# Patient Record
Sex: Male | Born: 1945 | Race: White | Hispanic: No | Marital: Married | Smoking: Current every day smoker
Health system: Southern US, Community
[De-identification: ages and names within clinical notes are randomized; demographics above are authoritative.]

---

## 2001-04-23 ENCOUNTER — Ambulatory Visit (HOSPITAL_COMMUNITY): Admission: RE | Admit: 2001-04-23 | Discharge: 2001-04-23 | Payer: Self-pay | Admitting: Interventional Cardiology

## 2006-02-22 ENCOUNTER — Ambulatory Visit (HOSPITAL_BASED_OUTPATIENT_CLINIC_OR_DEPARTMENT_OTHER): Admission: RE | Admit: 2006-02-22 | Discharge: 2006-02-22 | Payer: Self-pay | Admitting: Surgery

## 2007-06-02 ENCOUNTER — Other Ambulatory Visit: Admission: RE | Admit: 2007-06-02 | Discharge: 2007-06-02 | Payer: Self-pay | Admitting: Family Medicine

## 2010-07-28 NOTE — Cardiovascular Report (Signed)
[facility]  Patient:    Allen Rodgers, Allen Rodgers Visit Number: [id number] MRN: [id number]          Service Type: CAT Location: [facility] 2870 01 Attending Physician:  Lyn Records. Iii Dictated by:   Darci Needle, M.D. Proc. Date: 04/23/01 Admit Date:  04/23/2001 Discharge Date: 04/23/2001   CC:         Molly Maduro K. Abigail Miyamoto, M.D.  Volney American, M.D., [facility], [place]. of Cardiology                        Cardiac Catheterization  INDICATION FOR PROCEDURE:  The patient is asymptomatic and had an exercise Cardiolite stress test performed that demonstrated a large inferior wall defect that was felt to possibly represent diaphragm attenuation.  There were no ischemic EKG changes on electrocardiographic assessment with a good exercise tolerance with the patient going greater than eight minutes on the Bruce protocol.  Because of concern about the images however and in conjunction with the patients risk factor profile, I felt that coronary artery disease needed to be excluded.  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  Because of this I encouraged the patient to undergo coronary angiography to define coronary anatomy and help guide therapy.  PROCEDURE PERFORMED: 1. Left heart catheterization. 2. Selective coronary angiography. 3. Left ventriculography. 4. Perclose arteriotomy closure.  DESCRIPTION OF PROCEDURE:  After informed consent, a #6 French sheath was inserted into the right femoral artery using the modified Seldinger technique. A #6 French A2 multipurpose catheter was used for hemodynamic recordings, left ventriculography by hand injection and power injection, and right coronary angiography.  We used a #6 [place] #4 left Judkins catheter for  left coronary angiography.  The patient tolerated the procedure without significant complications.  A sheathogram in the right femoral demonstrated adequate position for Perclose.  This was performed without any complications or significant sequelae.  RESULTS:   I. Hemodynamic data      A. Aortic pressure 136/83.      B. Left ventricular pressure 136/18.  II. Left ventriculography:  The left ventricle was normal in size and      demonstrates overall normal contractility.  There is a region in the mid      inferior wall that is slightly deformed during systole but does show      normal systolic motion.  No mitral regurgitation. III. Selective coronary angiography      A. Left main coronary:  Normal.      B. Left anterior descending coronary:  The LAD gives origin to two         diagonal branches.  The entire LAD system is normal.  The LAD wraps         the apex.      C. Circumflex artery:  The circumflex artery is large and normal.  It         gives origin to two obtuse marginal branches.  The first obtuse         marginal is large and dominant and trifurcates on the left lateral         wall.  The third obtuse marginal is small and free of any significant         obstruction.  The  left atrial recurrent branch arises from the mid         circumflex.      D. Right coronary:  The right coronary is large.  It is free of any         significant obstruction.  It gives several small left ventricular         branches and a very small posterior descending branch.  Based on the size of the circumflex and the distribution of the right coronary, this would appear to be an adequate distribution of the distal right coronary.  I was unable to identify any definite evidence of inferior wall branch arteries that were occluded.  There was a large right ventricular branch that was widely patent as well.  CONCLUSIONS: 1. Normal coronary arteries. 2. Normal left ventricular function. 3. False  positive Cardiolite study and false positive MR function analysis.  RECOMMENDATIONS:  Ambulation and no restrictions on activity.Dictated by: Darci Needle, M.D. Attending Physician:  Lyn Records. Iii DD:  04/23/01 TD:  04/23/01 Job: [id number]

## 2010-07-28 NOTE — Op Note (Signed)
Allen Rodgers, Allen Rodgers             ACCOUNT NO.:  [id number]   MEDICAL RECORD NO.:  [id number]          PATIENT TYPE:  AMB   LOCATION:  DSC                          FACILITY:  [facility]   PHYSICIAN:  Wilmon Arms. Corliss Skains, M.D. DATE OF BIRTH:  1945/05/06   DATE OF PROCEDURE:  02/22/2006  DATE OF DISCHARGE:                               OPERATIVE REPORT   PREOPERATIVE DIAGNOSIS:  Right inguinal hernia.   POSTOPERATIVE DIAGNOSIS:  Right inguinal hernia.   PROCEDURE PERFORMED:  Right inguinal herniorrhaphy with mesh.   SURGEON:  Wilmon Arms. Tsuei, M.D.   ANESTHESIA:  General via LMA.   INDICATIONS:  The patient is a 65 year old male who recently noticed a  discomfort in his right groin.  This has developed into a bulge.  He was  examined in the office and was noted have a right inguinal hernia but no  evidence of a left inguinal hernia.  He had an easily reducible hernia.   DESCRIPTION OF PROCEDURE:  The patient is brought to the operating room  and placed in a supine position on the operating room table.  After an  adequate level of general anesthesia was obtained, the patient's right  groin was shaved, prepped with Betadine and draped in sterile fashion.  A time-out was taken to assure the proper patient and proper procedure.  The area above the inguinal ligament was infiltrated with 0.25%  Marcaine.  An oblique incision was made above the inguinal ligament.  Dissection was carried down through the subcutaneous tissues to the  external oblique fascia.  The external oblique fascia was opened along  the direction of its fibers, down the external ring.  The ilioinguinal  nerve was identified and was dissected free and retracted superiorly.  Blunt dissection was used to dissect around the spermatic cord.  This  was retracted with a Penrose drain.  The floor of the inguinal canal was  lax, and there was no direct defect.  There was a fairly large cord  lipoma and hernia sac which were  dissected free from the spermatic cord.  These were dissected back to the internal ring and reduced up through  the internal ring.  A medium old for pro mesh plug was inserted into the  internal ring.  The onlay portion of plug was secured with interrupted 2-  0 Prolene sutures.  A small slit was cut in the onlay portion to fit  around the spermatic cord.  The Ultrapro mesh patch was then cut to  keyhole fashion with a small slit.  This was secured beginning at the  pubic tubercle.  This was secured to the shelving edge inferiorly and  the internal oblique fascia superiorly.  The tails were sutured together  behind the spermatic cord, and the tails were tucked underneath the  external oblique fascia laterally.  The external oblique fascia was then reapproximated with 2-0 Vicryl.  Three-0 Vicryl was used to the close subcutaneous tissues and 4-0  Monocryl for the skin.  Steri-Strips and clean dressings were applied.  The patient was extubated and brought to recovery room in stable  condition.  All sponge, instrument, and needle counts were correct.      Wilmon Arms. Tsuei, M.D.  Electronically Signed     MKT/MEDQ  D:  02/22/2006  T:  02/22/2006  Job:  [id number]   cc:   Chales Salmon. Abigail Miyamoto, M.D.

## 2011-03-30 ENCOUNTER — Ambulatory Visit (INDEPENDENT_AMBULATORY_CARE_PROVIDER_SITE_OTHER): Payer: BC Managed Care – PPO

## 2011-03-30 DIAGNOSIS — R05 Cough: Secondary | ICD-10-CM

## 2011-03-30 DIAGNOSIS — J019 Acute sinusitis, unspecified: Secondary | ICD-10-CM

## 2011-10-01 ENCOUNTER — Ambulatory Visit (INDEPENDENT_AMBULATORY_CARE_PROVIDER_SITE_OTHER): Payer: BC Managed Care – PPO | Admitting: Family Medicine

## 2011-10-01 VITALS — BP 119/74 | HR 71 | Temp 98.2°F | Resp 18 | Ht 70.0 in | Wt 188.0 lb

## 2011-10-01 DIAGNOSIS — H5710 Ocular pain, unspecified eye: Secondary | ICD-10-CM

## 2011-10-01 DIAGNOSIS — H5711 Ocular pain, right eye: Secondary | ICD-10-CM

## 2011-10-01 MED ORDER — OFLOXACIN 0.3 % OP SOLN: OPHTHALMIC | Status: AC

## 2011-10-01 NOTE — Progress Notes (Signed)
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 He developed acute right eye pain. He is persisted in having the pain. It hurts most when he has his eyes open, gets relief by having his eye closed. He's tried OTC eye drops without relief. He was okay during the night last night it takes some ibuprofen, but got up this morning and open his eyes at proper started hurting again. His vision is a little bit decreased in the right eye. He normally wears reading glasses.  Objective: Visual acuity as noted. His eyes are both 1-2 mm, equal. Fundi appear benign. No gross abrasions will visible. He has a little swelling of the lateral epicanthus. No foreign body was seen. The eye was anesthetized and examined with fluorescein. No abrasions or foreign bodies could be noted.  Assessment: Eye pain post foreign  Antibiotic eyedrops. Given a couple days. If it is not doing better he is to return or see an ophthalmologist. If you'll call me we will refer him to an ophthalmologist.

## 2011-10-01 NOTE — Patient Instructions (Addendum)
Use eyedrops as directed  Call or return if worse or no better

## 2012-12-31 DIAGNOSIS — Z23 Encounter for immunization: Secondary | ICD-10-CM | POA: Diagnosis not present

## 2013-05-08 ENCOUNTER — Other Ambulatory Visit: Payer: Self-pay | Admitting: Family Medicine

## 2013-05-08 DIAGNOSIS — N289 Disorder of kidney and ureter, unspecified: Secondary | ICD-10-CM | POA: Diagnosis not present

## 2013-05-08 DIAGNOSIS — L219 Seborrheic dermatitis, unspecified: Secondary | ICD-10-CM | POA: Diagnosis not present

## 2013-05-08 DIAGNOSIS — F17200 Nicotine dependence, unspecified, uncomplicated: Secondary | ICD-10-CM

## 2013-05-08 DIAGNOSIS — I10 Essential (primary) hypertension: Secondary | ICD-10-CM | POA: Diagnosis not present

## 2013-05-08 DIAGNOSIS — N400 Enlarged prostate without lower urinary tract symptoms: Secondary | ICD-10-CM | POA: Diagnosis not present

## 2013-05-08 DIAGNOSIS — Z23 Encounter for immunization: Secondary | ICD-10-CM | POA: Diagnosis not present

## 2013-05-08 DIAGNOSIS — Z0000 Encounter for general adult medical examination without abnormal findings: Secondary | ICD-10-CM | POA: Diagnosis not present

## 2013-05-08 DIAGNOSIS — N529 Male erectile dysfunction, unspecified: Secondary | ICD-10-CM | POA: Diagnosis not present

## 2013-05-08 DIAGNOSIS — E782 Mixed hyperlipidemia: Secondary | ICD-10-CM | POA: Diagnosis not present

## 2013-05-08 DIAGNOSIS — Z125 Encounter for screening for malignant neoplasm of prostate: Secondary | ICD-10-CM | POA: Diagnosis not present

## 2013-05-25 ENCOUNTER — Ambulatory Visit
Admission: RE | Admit: 2013-05-25 | Discharge: 2013-05-25 | Disposition: A | Discharge status: Home / Self Care | Payer: Self-pay | Source: Ambulatory Visit | Attending: Family Medicine | Admitting: Family Medicine

## 2013-05-25 ENCOUNTER — Ambulatory Visit
Admission: RE | Admit: 2013-05-25 | Discharge: 2013-05-25 | Disposition: A | Discharge status: Home / Self Care | Payer: Medicare Other | Source: Ambulatory Visit | Attending: Family Medicine | Admitting: Family Medicine

## 2013-05-25 DIAGNOSIS — F17200 Nicotine dependence, unspecified, uncomplicated: Secondary | ICD-10-CM

## 2013-05-25 DIAGNOSIS — Z136 Encounter for screening for cardiovascular disorders: Secondary | ICD-10-CM | POA: Diagnosis not present

## 2013-08-28 DIAGNOSIS — M79609 Pain in unspecified limb: Secondary | ICD-10-CM | POA: Diagnosis not present

## 2013-09-18 DIAGNOSIS — M25579 Pain in unspecified ankle and joints of unspecified foot: Secondary | ICD-10-CM | POA: Diagnosis not present

## 2013-10-19 DIAGNOSIS — M19079 Primary osteoarthritis, unspecified ankle and foot: Secondary | ICD-10-CM | POA: Diagnosis not present

## 2013-11-23 DIAGNOSIS — M19079 Primary osteoarthritis, unspecified ankle and foot: Secondary | ICD-10-CM | POA: Diagnosis not present

## 2013-11-30 DIAGNOSIS — Z23 Encounter for immunization: Secondary | ICD-10-CM | POA: Diagnosis not present

## 2014-01-03 DIAGNOSIS — R509 Fever, unspecified: Secondary | ICD-10-CM | POA: Diagnosis not present

## 2014-01-03 DIAGNOSIS — J069 Acute upper respiratory infection, unspecified: Secondary | ICD-10-CM | POA: Diagnosis not present

## 2014-05-14 ENCOUNTER — Other Ambulatory Visit: Payer: Self-pay | Admitting: Family Medicine

## 2014-05-14 DIAGNOSIS — R0681 Apnea, not elsewhere classified: Secondary | ICD-10-CM | POA: Diagnosis not present

## 2014-05-14 DIAGNOSIS — R7989 Other specified abnormal findings of blood chemistry: Secondary | ICD-10-CM | POA: Diagnosis not present

## 2014-05-14 DIAGNOSIS — F17200 Nicotine dependence, unspecified, uncomplicated: Secondary | ICD-10-CM | POA: Diagnosis not present

## 2014-05-14 DIAGNOSIS — N529 Male erectile dysfunction, unspecified: Secondary | ICD-10-CM | POA: Diagnosis not present

## 2014-05-14 DIAGNOSIS — R8290 Unspecified abnormal findings in urine: Secondary | ICD-10-CM | POA: Diagnosis not present

## 2014-05-14 DIAGNOSIS — N400 Enlarged prostate without lower urinary tract symptoms: Secondary | ICD-10-CM | POA: Diagnosis not present

## 2014-05-14 DIAGNOSIS — Z0000 Encounter for general adult medical examination without abnormal findings: Secondary | ICD-10-CM | POA: Diagnosis not present

## 2014-05-14 DIAGNOSIS — E782 Mixed hyperlipidemia: Secondary | ICD-10-CM | POA: Diagnosis not present

## 2014-05-14 DIAGNOSIS — I10 Essential (primary) hypertension: Secondary | ICD-10-CM | POA: Diagnosis not present

## 2014-05-14 DIAGNOSIS — Z125 Encounter for screening for malignant neoplasm of prostate: Secondary | ICD-10-CM | POA: Diagnosis not present

## 2014-12-11 IMAGING — CT CT CHEST NODULE FOLLOW UP LOW DOSE W/O CM
1 of 4 series · 15 of 36 positions shown, 19 images · non-contrast
Comparison: None.

CLINICAL DATA: Lung cancer screening.  Ex smoker.

EXAM:
CT CHEST SCREENING WITHOUT CONTRAST
TECHNIQUE: Multidetector CT imaging of the chest was performed following the
standard low-dose protocol without IV contrast.

[Series 3: lung windows · axial · 0.70mm/px · z∈[-373,-57]mm · 15 of 281 slices shown, 19 images]
[im 14/281  mediastinal]
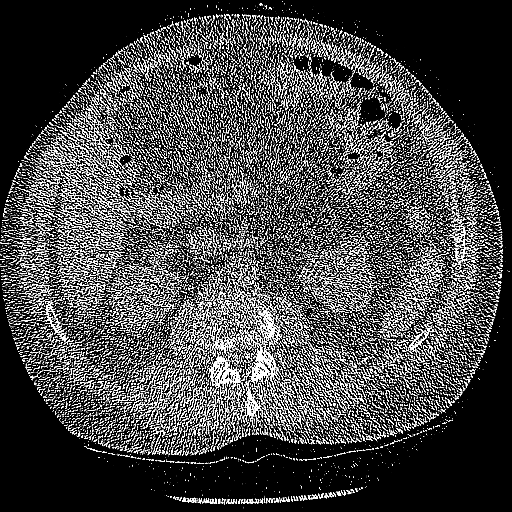
[im 14/281  lung]
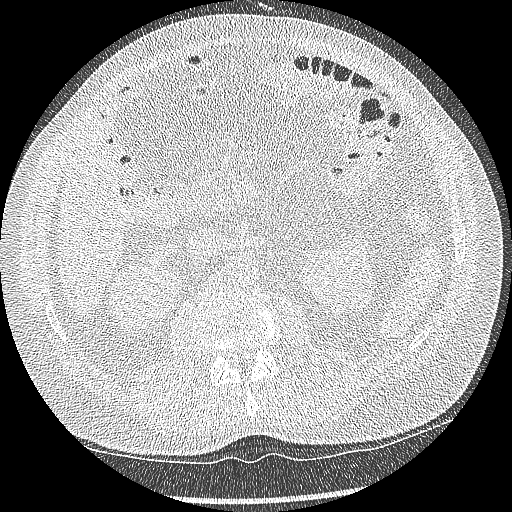
[im 41/281  lung]
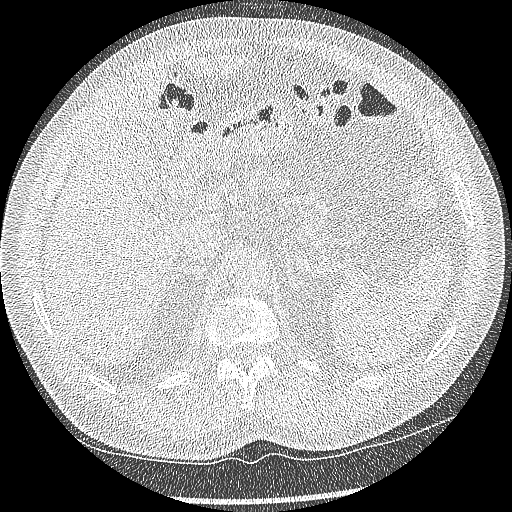
[im 54/281  lung]
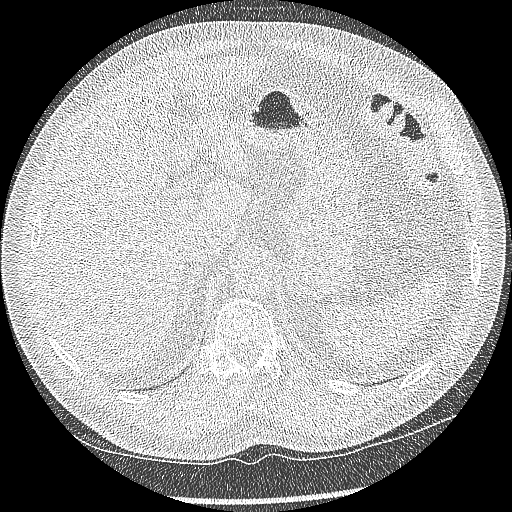
[im 67/281  lung]
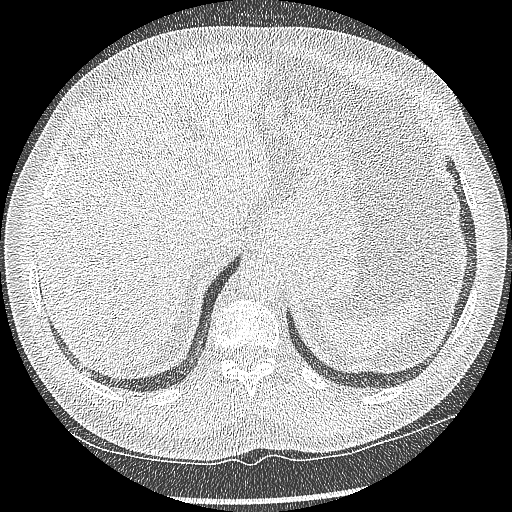
[im 94/281  mediastinal]
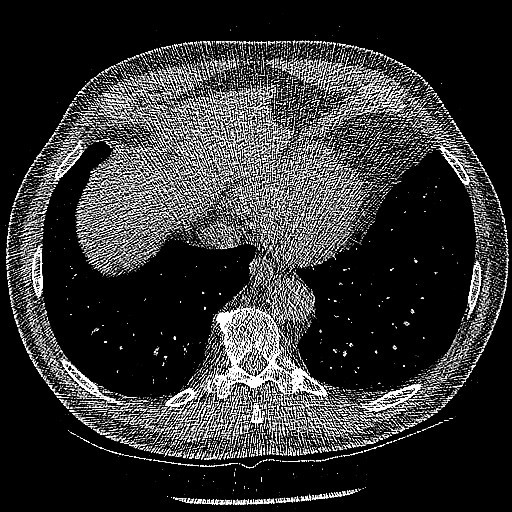
[im 94/281  lung]
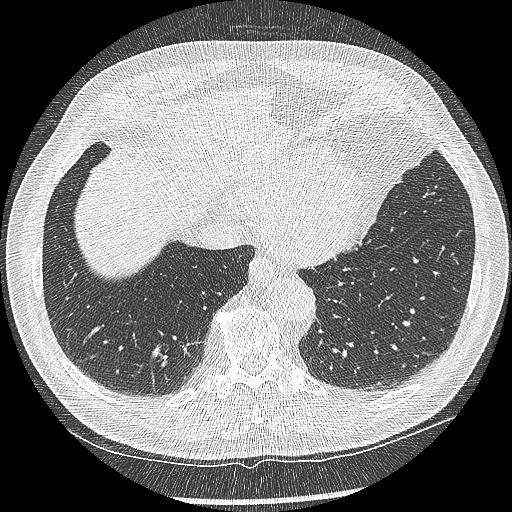
[im 107/281  lung]
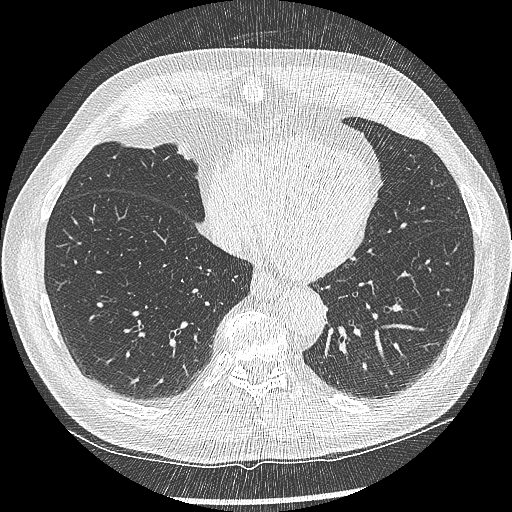
[im 121/281  lung]
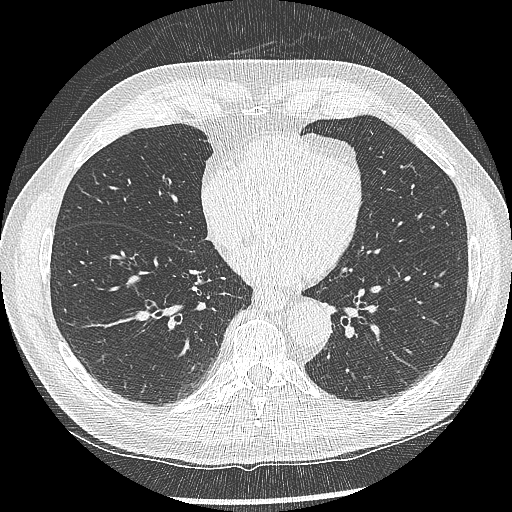
[im 147/281  lung]
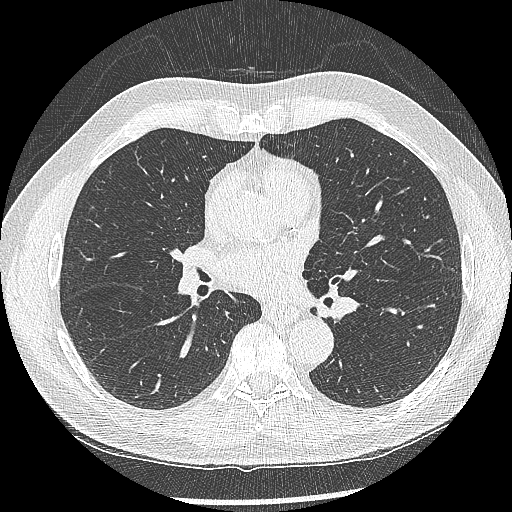
[im 161/281  mediastinal]
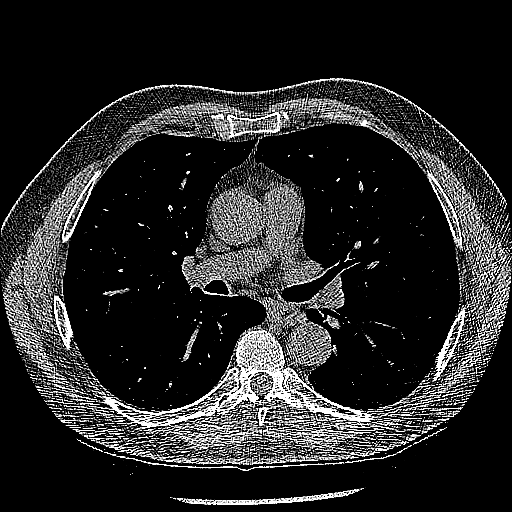
[im 161/281  lung]
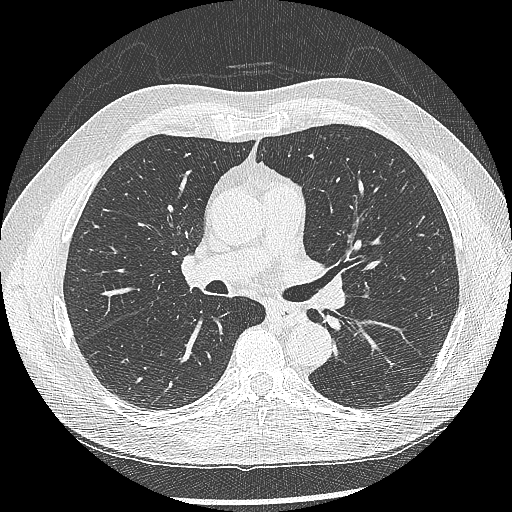
[im 174/281  lung]
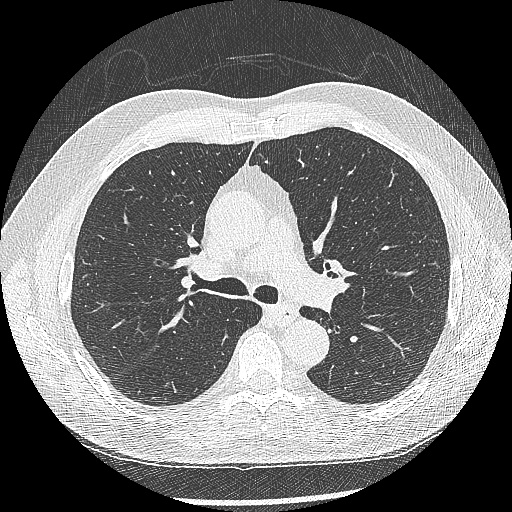
[im 201/281  lung]
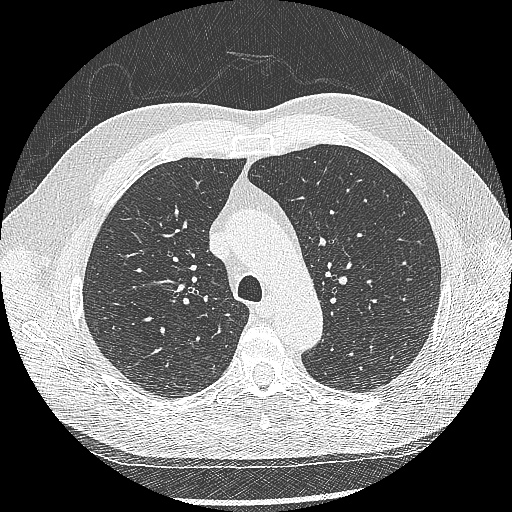
[im 214/281  lung]
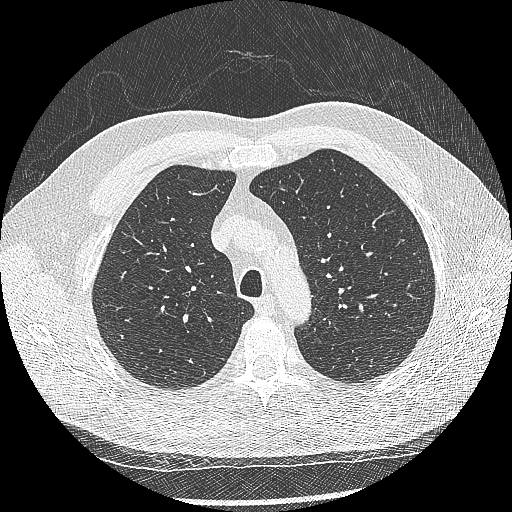
[im 227/281  mediastinal]
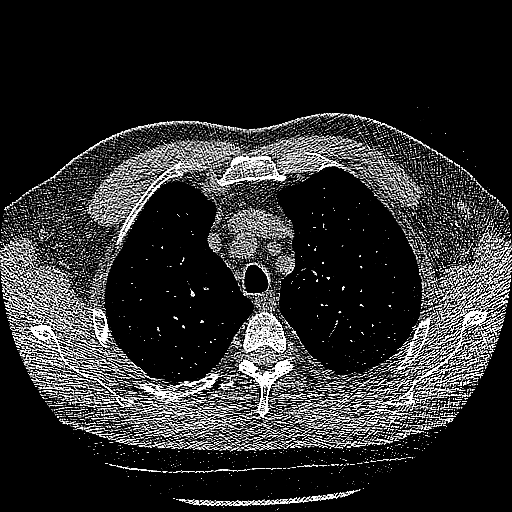
[im 227/281  lung]
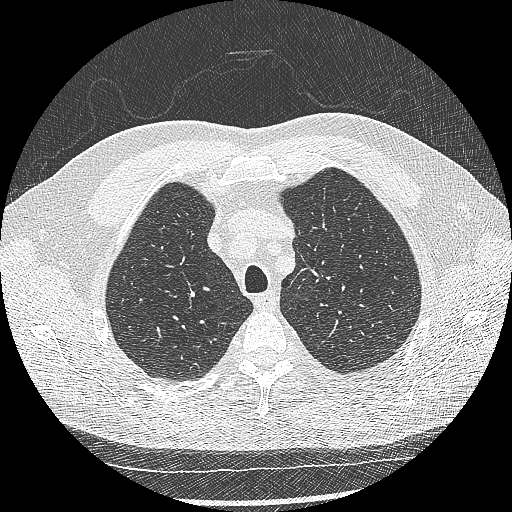
[im 254/281  lung]
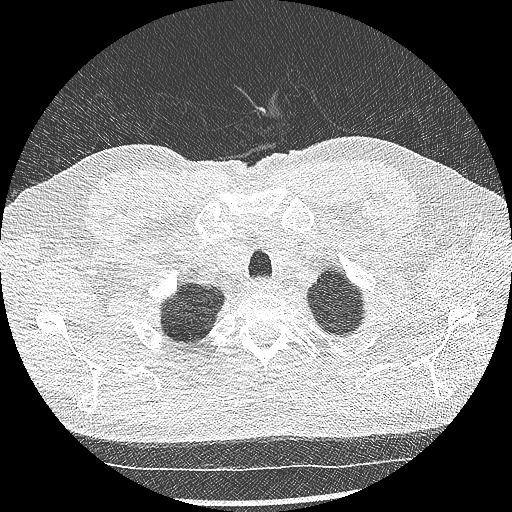
[im 267/281  lung]
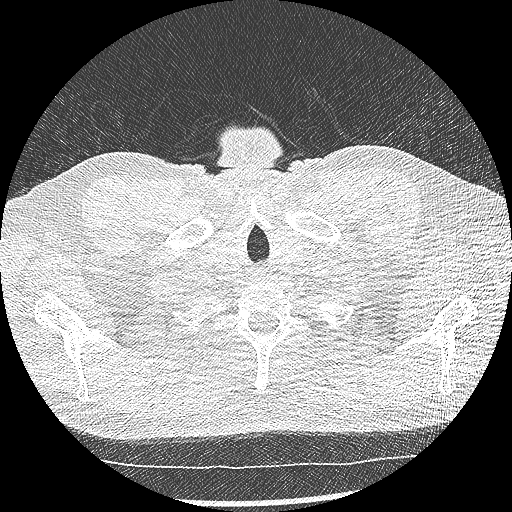

[15 of 36 positions shown; findings below may reference images not displayed]

FINDINGS: No pathologically enlarged mediastinal or axillary lymph nodes.
Hilar regions are difficult to definitively evaluate but appear
grossly unremarkable. Atherosclerotic calcification of the arterial
vasculature, including three-vessel involvement of the coronary
arteries. Ascending aorta measures at the upper limits of normal,
3.9 cm. Heart size normal. No pericardial effusion.

Body habitus degrades image quality. No dominant pulmonary nodule.
Centrilobular emphysema. Minimal scarring or atelectasis in the
lower lobes. Lungs are otherwise clear. No pleural fluid. Airway is
unremarkable.

Incidental imaging of the upper abdomen shows the visualized
portions of the liver, gallbladder, adrenal glands, kidneys, spleen,
pancreas, stomach and bowel to be grossly unremarkable. No worrisome
lytic or sclerotic lesions. Degenerative changes are seen in the
spine.
IMPRESSION: 1. Lung-RADS Category 1, negative. Continue annual screening with
low-dose chest CT without contrast in 12 months. These results will
be called to the ordering clinician or representative by the
Radiologist Assistant, and communication documented in the PACS
Dashboard.
2. Three-vessel coronary artery calcification.
3. Borderline ascending aortic aneurysm.

## 2015-06-10 ENCOUNTER — Other Ambulatory Visit: Payer: Self-pay | Admitting: Family Medicine

## 2015-06-10 DIAGNOSIS — I10 Essential (primary) hypertension: Secondary | ICD-10-CM | POA: Diagnosis not present

## 2015-06-10 DIAGNOSIS — N400 Enlarged prostate without lower urinary tract symptoms: Secondary | ICD-10-CM | POA: Diagnosis not present

## 2015-06-10 DIAGNOSIS — R8290 Unspecified abnormal findings in urine: Secondary | ICD-10-CM | POA: Diagnosis not present

## 2015-06-10 DIAGNOSIS — Z125 Encounter for screening for malignant neoplasm of prostate: Secondary | ICD-10-CM | POA: Diagnosis not present

## 2015-06-10 DIAGNOSIS — Z87891 Personal history of nicotine dependence: Secondary | ICD-10-CM

## 2015-06-10 DIAGNOSIS — E782 Mixed hyperlipidemia: Secondary | ICD-10-CM | POA: Diagnosis not present

## 2015-06-10 DIAGNOSIS — Z0000 Encounter for general adult medical examination without abnormal findings: Secondary | ICD-10-CM | POA: Diagnosis not present

## 2015-06-10 DIAGNOSIS — N529 Male erectile dysfunction, unspecified: Secondary | ICD-10-CM | POA: Diagnosis not present

## 2015-11-29 DIAGNOSIS — Z23 Encounter for immunization: Secondary | ICD-10-CM | POA: Diagnosis not present

## 2015-12-23 DIAGNOSIS — L821 Other seborrheic keratosis: Secondary | ICD-10-CM | POA: Diagnosis not present

## 2016-02-24 DIAGNOSIS — H2513 Age-related nuclear cataract, bilateral: Secondary | ICD-10-CM | POA: Diagnosis not present

## 2016-02-24 DIAGNOSIS — H25011 Cortical age-related cataract, right eye: Secondary | ICD-10-CM | POA: Diagnosis not present

## 2016-02-24 DIAGNOSIS — H35372 Puckering of macula, left eye: Secondary | ICD-10-CM | POA: Diagnosis not present

## 2016-02-24 DIAGNOSIS — H5203 Hypermetropia, bilateral: Secondary | ICD-10-CM | POA: Diagnosis not present

## 2016-04-17 DIAGNOSIS — L820 Inflamed seborrheic keratosis: Secondary | ICD-10-CM | POA: Diagnosis not present

## 2016-04-17 DIAGNOSIS — D224 Melanocytic nevi of scalp and neck: Secondary | ICD-10-CM | POA: Diagnosis not present

## 2016-07-25 DIAGNOSIS — R8290 Unspecified abnormal findings in urine: Secondary | ICD-10-CM | POA: Diagnosis not present

## 2016-07-25 DIAGNOSIS — Z0000 Encounter for general adult medical examination without abnormal findings: Secondary | ICD-10-CM | POA: Diagnosis not present

## 2016-07-25 DIAGNOSIS — N400 Enlarged prostate without lower urinary tract symptoms: Secondary | ICD-10-CM | POA: Diagnosis not present

## 2016-07-25 DIAGNOSIS — R972 Elevated prostate specific antigen [PSA]: Secondary | ICD-10-CM | POA: Diagnosis not present

## 2016-07-25 DIAGNOSIS — J449 Chronic obstructive pulmonary disease, unspecified: Secondary | ICD-10-CM | POA: Diagnosis not present

## 2016-07-25 DIAGNOSIS — N529 Male erectile dysfunction, unspecified: Secondary | ICD-10-CM | POA: Diagnosis not present

## 2016-07-25 DIAGNOSIS — I10 Essential (primary) hypertension: Secondary | ICD-10-CM | POA: Diagnosis not present

## 2016-07-25 DIAGNOSIS — R938 Abnormal findings on diagnostic imaging of other specified body structures: Secondary | ICD-10-CM | POA: Diagnosis not present

## 2016-07-25 DIAGNOSIS — N289 Disorder of kidney and ureter, unspecified: Secondary | ICD-10-CM | POA: Diagnosis not present

## 2016-07-25 DIAGNOSIS — Z125 Encounter for screening for malignant neoplasm of prostate: Secondary | ICD-10-CM | POA: Diagnosis not present

## 2016-07-25 DIAGNOSIS — E782 Mixed hyperlipidemia: Secondary | ICD-10-CM | POA: Diagnosis not present

## 2016-07-26 DIAGNOSIS — Z125 Encounter for screening for malignant neoplasm of prostate: Secondary | ICD-10-CM | POA: Diagnosis not present

## 2016-07-26 DIAGNOSIS — E782 Mixed hyperlipidemia: Secondary | ICD-10-CM | POA: Diagnosis not present

## 2016-07-26 DIAGNOSIS — Z0000 Encounter for general adult medical examination without abnormal findings: Secondary | ICD-10-CM | POA: Diagnosis not present

## 2016-07-26 DIAGNOSIS — J449 Chronic obstructive pulmonary disease, unspecified: Secondary | ICD-10-CM | POA: Diagnosis not present

## 2016-07-26 DIAGNOSIS — R938 Abnormal findings on diagnostic imaging of other specified body structures: Secondary | ICD-10-CM | POA: Diagnosis not present

## 2016-07-26 DIAGNOSIS — N529 Male erectile dysfunction, unspecified: Secondary | ICD-10-CM | POA: Diagnosis not present

## 2016-07-26 DIAGNOSIS — I10 Essential (primary) hypertension: Secondary | ICD-10-CM | POA: Diagnosis not present

## 2016-07-26 DIAGNOSIS — R8290 Unspecified abnormal findings in urine: Secondary | ICD-10-CM | POA: Diagnosis not present

## 2016-07-26 DIAGNOSIS — N400 Enlarged prostate without lower urinary tract symptoms: Secondary | ICD-10-CM | POA: Diagnosis not present

## 2016-07-30 ENCOUNTER — Other Ambulatory Visit: Payer: Self-pay | Admitting: Family Medicine

## 2016-07-30 DIAGNOSIS — N289 Disorder of kidney and ureter, unspecified: Secondary | ICD-10-CM

## 2016-07-31 ENCOUNTER — Ambulatory Visit
Admission: RE | Admit: 2016-07-31 | Discharge: 2016-07-31 | Disposition: A | Discharge status: Home / Self Care | Payer: Medicare Other | Source: Ambulatory Visit | Attending: Family Medicine | Admitting: Family Medicine

## 2016-07-31 DIAGNOSIS — R944 Abnormal results of kidney function studies: Secondary | ICD-10-CM | POA: Diagnosis not present

## 2016-07-31 DIAGNOSIS — N289 Disorder of kidney and ureter, unspecified: Secondary | ICD-10-CM

## 2016-08-29 DIAGNOSIS — R899 Unspecified abnormal finding in specimens from other organs, systems and tissues: Secondary | ICD-10-CM | POA: Diagnosis not present

## 2016-08-29 DIAGNOSIS — N400 Enlarged prostate without lower urinary tract symptoms: Secondary | ICD-10-CM | POA: Diagnosis not present

## 2016-08-30 ENCOUNTER — Other Ambulatory Visit: Payer: Self-pay | Admitting: Family Medicine

## 2016-08-30 DIAGNOSIS — R9389 Abnormal findings on diagnostic imaging of other specified body structures: Secondary | ICD-10-CM

## 2016-08-30 DIAGNOSIS — I719 Aortic aneurysm of unspecified site, without rupture: Secondary | ICD-10-CM

## 2016-09-04 ENCOUNTER — Other Ambulatory Visit: Payer: Medicare Other

## 2016-09-05 ENCOUNTER — Ambulatory Visit
Admission: RE | Admit: 2016-09-05 | Discharge: 2016-09-05 | Disposition: A | Discharge status: Home / Self Care | Payer: Medicare Other | Source: Ambulatory Visit | Attending: Family Medicine | Admitting: Family Medicine

## 2016-09-05 DIAGNOSIS — R9389 Abnormal findings on diagnostic imaging of other specified body structures: Secondary | ICD-10-CM

## 2016-09-05 DIAGNOSIS — J439 Emphysema, unspecified: Secondary | ICD-10-CM | POA: Diagnosis not present

## 2016-09-05 MED ORDER — IOPAMIDOL (ISOVUE-300) INJECTION 61%
75.0000 mL | Freq: Once | INTRAVENOUS | Status: AC | PRN
  Administered 2016-09-05: 75 mL via INTRAVENOUS

## 2016-12-19 DIAGNOSIS — Z23 Encounter for immunization: Secondary | ICD-10-CM | POA: Diagnosis not present

## 2017-03-21 DIAGNOSIS — H5203 Hypermetropia, bilateral: Secondary | ICD-10-CM | POA: Diagnosis not present

## 2017-03-21 DIAGNOSIS — H35372 Puckering of macula, left eye: Secondary | ICD-10-CM | POA: Diagnosis not present

## 2017-03-21 DIAGNOSIS — H25011 Cortical age-related cataract, right eye: Secondary | ICD-10-CM | POA: Diagnosis not present

## 2017-08-21 DIAGNOSIS — J449 Chronic obstructive pulmonary disease, unspecified: Secondary | ICD-10-CM | POA: Diagnosis not present

## 2017-08-21 DIAGNOSIS — E782 Mixed hyperlipidemia: Secondary | ICD-10-CM | POA: Diagnosis not present

## 2017-08-21 DIAGNOSIS — Z0000 Encounter for general adult medical examination without abnormal findings: Secondary | ICD-10-CM | POA: Diagnosis not present

## 2017-08-21 DIAGNOSIS — N529 Male erectile dysfunction, unspecified: Secondary | ICD-10-CM | POA: Diagnosis not present

## 2017-08-21 DIAGNOSIS — Z1211 Encounter for screening for malignant neoplasm of colon: Secondary | ICD-10-CM | POA: Diagnosis not present

## 2017-08-21 DIAGNOSIS — I10 Essential (primary) hypertension: Secondary | ICD-10-CM | POA: Diagnosis not present

## 2017-08-21 DIAGNOSIS — I712 Thoracic aortic aneurysm, without rupture: Secondary | ICD-10-CM | POA: Diagnosis not present

## 2017-08-21 DIAGNOSIS — Z1389 Encounter for screening for other disorder: Secondary | ICD-10-CM | POA: Diagnosis not present

## 2017-08-21 DIAGNOSIS — Z125 Encounter for screening for malignant neoplasm of prostate: Secondary | ICD-10-CM | POA: Diagnosis not present

## 2017-08-21 DIAGNOSIS — Z23 Encounter for immunization: Secondary | ICD-10-CM | POA: Diagnosis not present

## 2017-08-21 DIAGNOSIS — N400 Enlarged prostate without lower urinary tract symptoms: Secondary | ICD-10-CM | POA: Diagnosis not present

## 2017-12-19 DIAGNOSIS — Z23 Encounter for immunization: Secondary | ICD-10-CM | POA: Diagnosis not present

## 2018-10-08 ENCOUNTER — Other Ambulatory Visit: Payer: Self-pay | Admitting: Family Medicine

## 2018-10-08 DIAGNOSIS — I10 Essential (primary) hypertension: Secondary | ICD-10-CM | POA: Diagnosis not present

## 2018-10-08 DIAGNOSIS — Z125 Encounter for screening for malignant neoplasm of prostate: Secondary | ICD-10-CM | POA: Diagnosis not present

## 2018-10-08 DIAGNOSIS — J449 Chronic obstructive pulmonary disease, unspecified: Secondary | ICD-10-CM | POA: Diagnosis not present

## 2018-10-08 DIAGNOSIS — I712 Thoracic aortic aneurysm, without rupture: Secondary | ICD-10-CM

## 2018-10-08 DIAGNOSIS — I7121 Aneurysm of the ascending aorta, without rupture: Secondary | ICD-10-CM

## 2018-10-08 DIAGNOSIS — E782 Mixed hyperlipidemia: Secondary | ICD-10-CM | POA: Diagnosis not present

## 2018-10-08 DIAGNOSIS — N400 Enlarged prostate without lower urinary tract symptoms: Secondary | ICD-10-CM | POA: Diagnosis not present

## 2018-10-08 DIAGNOSIS — R7301 Impaired fasting glucose: Secondary | ICD-10-CM | POA: Diagnosis not present

## 2018-10-08 DIAGNOSIS — Z1211 Encounter for screening for malignant neoplasm of colon: Secondary | ICD-10-CM | POA: Diagnosis not present

## 2018-10-20 ENCOUNTER — Ambulatory Visit
Admission: RE | Admit: 2018-10-20 | Discharge: 2018-10-20 | Disposition: A | Discharge status: Home / Self Care | Payer: Medicare Other | Source: Ambulatory Visit | Attending: Family Medicine | Admitting: Family Medicine

## 2018-10-20 DIAGNOSIS — I7121 Aneurysm of the ascending aorta, without rupture: Secondary | ICD-10-CM

## 2018-10-20 DIAGNOSIS — I712 Thoracic aortic aneurysm, without rupture: Secondary | ICD-10-CM

## 2018-10-20 MED ORDER — IOPAMIDOL (ISOVUE-300) INJECTION 61%
75.0000 mL | Freq: Once | INTRAVENOUS | Status: AC | PRN
  Administered 2018-10-20: 75 mL via INTRAVENOUS

## 2018-10-21 DIAGNOSIS — Z125 Encounter for screening for malignant neoplasm of prostate: Secondary | ICD-10-CM | POA: Diagnosis not present

## 2018-10-21 DIAGNOSIS — Z1211 Encounter for screening for malignant neoplasm of colon: Secondary | ICD-10-CM | POA: Diagnosis not present

## 2018-10-21 DIAGNOSIS — I10 Essential (primary) hypertension: Secondary | ICD-10-CM | POA: Diagnosis not present

## 2018-12-02 DIAGNOSIS — Z1211 Encounter for screening for malignant neoplasm of colon: Secondary | ICD-10-CM | POA: Diagnosis not present

## 2018-12-09 DIAGNOSIS — Z23 Encounter for immunization: Secondary | ICD-10-CM | POA: Diagnosis not present

## 2019-01-12 DIAGNOSIS — H2513 Age-related nuclear cataract, bilateral: Secondary | ICD-10-CM | POA: Diagnosis not present

## 2019-01-12 DIAGNOSIS — H5203 Hypermetropia, bilateral: Secondary | ICD-10-CM | POA: Diagnosis not present

## 2019-01-12 DIAGNOSIS — H35373 Puckering of macula, bilateral: Secondary | ICD-10-CM | POA: Diagnosis not present

## 2019-10-14 DIAGNOSIS — Z1159 Encounter for screening for other viral diseases: Secondary | ICD-10-CM | POA: Diagnosis not present

## 2019-10-14 DIAGNOSIS — R8290 Unspecified abnormal findings in urine: Secondary | ICD-10-CM | POA: Diagnosis not present

## 2019-10-14 DIAGNOSIS — Z1211 Encounter for screening for malignant neoplasm of colon: Secondary | ICD-10-CM | POA: Diagnosis not present

## 2019-10-14 DIAGNOSIS — E782 Mixed hyperlipidemia: Secondary | ICD-10-CM | POA: Diagnosis not present

## 2019-10-14 DIAGNOSIS — N400 Enlarged prostate without lower urinary tract symptoms: Secondary | ICD-10-CM | POA: Diagnosis not present

## 2019-10-14 DIAGNOSIS — I10 Essential (primary) hypertension: Secondary | ICD-10-CM | POA: Diagnosis not present

## 2019-10-14 DIAGNOSIS — R7301 Impaired fasting glucose: Secondary | ICD-10-CM | POA: Diagnosis not present

## 2019-10-14 DIAGNOSIS — Z125 Encounter for screening for malignant neoplasm of prostate: Secondary | ICD-10-CM | POA: Diagnosis not present

## 2019-10-14 DIAGNOSIS — R899 Unspecified abnormal finding in specimens from other organs, systems and tissues: Secondary | ICD-10-CM | POA: Diagnosis not present

## 2019-10-14 DIAGNOSIS — N529 Male erectile dysfunction, unspecified: Secondary | ICD-10-CM | POA: Diagnosis not present

## 2019-10-14 DIAGNOSIS — Z0000 Encounter for general adult medical examination without abnormal findings: Secondary | ICD-10-CM | POA: Diagnosis not present

## 2019-10-14 DIAGNOSIS — J449 Chronic obstructive pulmonary disease, unspecified: Secondary | ICD-10-CM | POA: Diagnosis not present

## 2019-10-21 DIAGNOSIS — Z1211 Encounter for screening for malignant neoplasm of colon: Secondary | ICD-10-CM | POA: Diagnosis not present

## 2019-11-18 DIAGNOSIS — Z23 Encounter for immunization: Secondary | ICD-10-CM | POA: Diagnosis not present

## 2020-01-13 DIAGNOSIS — Z23 Encounter for immunization: Secondary | ICD-10-CM | POA: Diagnosis not present

## 2020-01-18 DIAGNOSIS — H35373 Puckering of macula, bilateral: Secondary | ICD-10-CM | POA: Diagnosis not present

## 2020-01-18 DIAGNOSIS — H5203 Hypermetropia, bilateral: Secondary | ICD-10-CM | POA: Diagnosis not present

## 2020-10-11 DIAGNOSIS — E782 Mixed hyperlipidemia: Secondary | ICD-10-CM | POA: Diagnosis not present

## 2020-10-11 DIAGNOSIS — N529 Male erectile dysfunction, unspecified: Secondary | ICD-10-CM | POA: Diagnosis not present

## 2020-10-11 DIAGNOSIS — Z1211 Encounter for screening for malignant neoplasm of colon: Secondary | ICD-10-CM | POA: Diagnosis not present

## 2020-10-11 DIAGNOSIS — N400 Enlarged prostate without lower urinary tract symptoms: Secondary | ICD-10-CM | POA: Diagnosis not present

## 2020-10-11 DIAGNOSIS — Z0000 Encounter for general adult medical examination without abnormal findings: Secondary | ICD-10-CM | POA: Diagnosis not present

## 2020-10-11 DIAGNOSIS — R7301 Impaired fasting glucose: Secondary | ICD-10-CM | POA: Diagnosis not present

## 2020-10-11 DIAGNOSIS — I10 Essential (primary) hypertension: Secondary | ICD-10-CM | POA: Diagnosis not present

## 2020-10-11 DIAGNOSIS — Z125 Encounter for screening for malignant neoplasm of prostate: Secondary | ICD-10-CM | POA: Diagnosis not present

## 2020-10-14 DIAGNOSIS — Z1211 Encounter for screening for malignant neoplasm of colon: Secondary | ICD-10-CM | POA: Diagnosis not present

## 2020-10-17 DIAGNOSIS — Z1389 Encounter for screening for other disorder: Secondary | ICD-10-CM | POA: Diagnosis not present

## 2020-10-17 DIAGNOSIS — Z0000 Encounter for general adult medical examination without abnormal findings: Secondary | ICD-10-CM | POA: Diagnosis not present

## 2020-12-15 DIAGNOSIS — Z23 Encounter for immunization: Secondary | ICD-10-CM | POA: Diagnosis not present

## 2021-03-22 DIAGNOSIS — Z23 Encounter for immunization: Secondary | ICD-10-CM | POA: Diagnosis not present

## 2021-04-24 DIAGNOSIS — H02834 Dermatochalasis of left upper eyelid: Secondary | ICD-10-CM | POA: Diagnosis not present

## 2021-04-24 DIAGNOSIS — H02831 Dermatochalasis of right upper eyelid: Secondary | ICD-10-CM | POA: Diagnosis not present

## 2021-04-24 DIAGNOSIS — H35372 Puckering of macula, left eye: Secondary | ICD-10-CM | POA: Diagnosis not present

## 2021-04-24 DIAGNOSIS — H2513 Age-related nuclear cataract, bilateral: Secondary | ICD-10-CM | POA: Diagnosis not present

## 2021-07-07 ENCOUNTER — Ambulatory Visit (INDEPENDENT_AMBULATORY_CARE_PROVIDER_SITE_OTHER): Payer: Medicare Other

## 2021-07-07 ENCOUNTER — Ambulatory Visit (INDEPENDENT_AMBULATORY_CARE_PROVIDER_SITE_OTHER): Payer: Medicare Other | Admitting: Podiatry

## 2021-07-07 DIAGNOSIS — M7751 Other enthesopathy of right foot: Secondary | ICD-10-CM

## 2021-07-07 DIAGNOSIS — M109 Gout, unspecified: Secondary | ICD-10-CM

## 2021-07-07 DIAGNOSIS — M779 Enthesopathy, unspecified: Secondary | ICD-10-CM

## 2021-07-07 DIAGNOSIS — M205X9 Other deformities of toe(s) (acquired), unspecified foot: Secondary | ICD-10-CM | POA: Diagnosis not present

## 2021-07-07 MED ORDER — TRIAMCINOLONE ACETONIDE 10 MG/ML IJ SUSP
10.0000 mg | Freq: Once | INTRAMUSCULAR | Status: AC
  Administered 2021-07-07: 10 mg

## 2021-07-07 NOTE — Progress Notes (Signed)
Subjective:  ? ?Patient ID: Allen Rodgers, male   DOB: 76 y.o.   MRN: [id number]  ? ?HPI ?Patient presents stating that he has had trouble with his big toe joints to a mild nature that is been going on for years but 3 days ago he developed severe redness and pain around the big toe joint right and its made it hard for him to sleep.  Patient does not smoke currently and is very active for his age ? ? ?Review of Systems  ?All other systems reviewed and are negative. ? ? ?   ?Objective:  ?Physical Exam ?Vitals and nursing note reviewed.  ?Constitutional:   ?   Appearance: He is well-developed.  ?Pulmonary:  ?   Effort: Pulmonary effort is normal.  ?Musculoskeletal:     ?   General: Normal range of motion.  ?Skin: ?   General: Skin is warm.  ?Neurological:  ?   Mental Status: He is alert.  ?  ?Neurovascular status intact muscle strength found to be adequate range of motion within normal limits.  Patient is found to have redness and inflammation with swelling around the big toe joint right foot with fluid buildup around the joint and it is painful when pressed.  Patient has good digital perfusion well oriented x3 ? ?   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 ?   ?Plan:  ?H&P reviewed both feet x-rays and at this point organ to focus on this right 1 and I did explain gout and capsulitis and I did sterile prep and then did periarticular injection first MPJ 3 mg Kenalog 5 mg Xylocaine advised on anti-inflammatory usage soaks and reappoint 1 week to recheck may require blood work. ? 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   ? ? ?

## 2021-07-07 NOTE — Patient Instructions (Signed)
Gout  Gout is painful swelling of your joints. Gout is a type of arthritis. It is caused by having too much uric acid in your body. Uric acid is a chemical that is made when your body breaks down substances called purines. If your body has too much uric acid, sharp crystals can form and build up in your joints. This causes pain and swelling. Gout attacks can happen quickly and be very painful (acute gout). Over time, the attacks can affect more joints and happen more often (chronic gout). What are the causes? Gout is caused by too much uric acid in your blood. This can happen because: Your kidneys do not remove enough uric acid from your blood. Your body makes too much uric acid. You eat too many foods that are high in purines. These foods include organ meats, some seafood, and beer. Trauma or stress can bring on an attack. What increases the risk? Having a family history of gout. Being male and middle-aged. Being male and having gone through menopause. Having an organ transplant. Taking certain medicines. Having certain conditions, such as: Being very overweight (obese). Lead poisoning. Kidney disease. A skin condition called psoriasis. Other risks include: Losing weight too quickly. Not having enough water in the body (being dehydrated). Drinking alcohol, especially beer. Drinking beverages that are sweetened with a type of sugar called fructose. What are the signs or symptoms? An attack of acute gout often starts at night and usually happens in just one joint. The most common place is the big toe. Other joints that may be affected include joints of the feet, ankle, knee, fingers, wrist, or elbow. Symptoms may include: Very bad pain. Warmth. Swelling. Stiffness. Tenderness. The affected joint may be very painful to touch. Shiny, red, or purple skin. Chills and fever. Chronic gout may cause symptoms more often. More joints may be involved. You may also have white or yellow lumps  (tophi) on your hands or feet or in other areas near your joints. How is this treated? Treatment for an acute attack may include medicines for pain and swelling, such as: NSAIDs, such as ibuprofen. Steroids taken by mouth or injected into a joint. Colchicine. This can be given by mouth or through an IV tube. Treatment to prevent future attacks may include: Taking small doses of NSAIDs or colchicine daily. Using a medicine that reduces uric acid levels in your blood, such as allopurinol. Making changes to your diet. You may need to see a food expert (dietitian) about what to eat and drink to prevent gout. Follow these instructions at home: During a gout attack  If told, put ice on the painful area. To do this: Put ice in a plastic bag. Place a towel between your skin and the bag. Leave the ice on for 20 minutes, 2-3 times a day. Take off the ice if your skin turns bright red. This is very important. If you cannot feel pain, heat, or cold, you have a greater risk of damage to the area. Raise the painful joint above the level of your heart as often as you can. Rest the joint as much as possible. If the joint is in your leg, you may be given crutches. Follow instructions from your doctor about what you cannot eat or drink. Avoiding future gout attacks Eat a low-purine diet. Avoid foods and drinks such as: Liver. Kidney. Anchovies. Asparagus. Herring. Mushrooms. Mussels. Beer. Stay at a healthy weight. If you want to lose weight, talk with your doctor. Do not   lose weight too fast. Start or continue an exercise plan as told by your doctor. Eating and drinking Avoid drinks sweetened by fructose. Drink enough fluids to keep your pee (urine) pale yellow. If you drink alcohol: Limit how much you have to: 0-1 drink a day for women who are not pregnant. 0-2 drinks a day for men. Know how much alcohol is in a drink. In the [place] one drink equals one 12 oz bottle of beer (355 mL), one 5 oz  glass of wine (148 mL), or one 1 oz glass of hard liquor (44 mL). General instructions Take over-the-counter and prescription medicines only as told by your doctor. Ask your doctor if you should avoid driving or using machines while you are taking your medicine. Return to your normal activities when your doctor says that it is safe. Keep all follow-up visits. Where to find more information National Institutes of Health: www.niams.nih.gov Contact a doctor if: You have another gout attack. You still have symptoms of a gout attack after 10 days of treatment. You have problems (side effects) because of your medicines. You have chills or a fever. You have burning pain when you pee (urinate). You have pain in your lower back or belly. Get help right away if: You have very bad pain. Your pain cannot be controlled. You cannot pee. Summary Gout is painful swelling of the joints. The most common site of pain is the big toe, but it can affect other joints. Medicines and avoiding some foods can help to prevent and treat gout attacks. This information is not intended to replace advice given to you by your health care provider. Make sure you discuss any questions you have with your health care provider. Document Revised: 11/30/2020 Document Reviewed: 11/30/2020 Elsevier Patient Education  2023 Elsevier Inc.  

## 2021-07-14 ENCOUNTER — Encounter: Payer: Self-pay | Admitting: Podiatry

## 2021-07-14 ENCOUNTER — Ambulatory Visit (INDEPENDENT_AMBULATORY_CARE_PROVIDER_SITE_OTHER): Payer: Medicare Other | Admitting: Podiatry

## 2021-07-14 DIAGNOSIS — M205X9 Other deformities of toe(s) (acquired), unspecified foot: Secondary | ICD-10-CM

## 2021-07-14 DIAGNOSIS — M7751 Other enthesopathy of right foot: Secondary | ICD-10-CM | POA: Diagnosis not present

## 2021-07-14 DIAGNOSIS — M109 Gout, unspecified: Secondary | ICD-10-CM

## 2021-07-14 MED ORDER — TRIAMCINOLONE ACETONIDE 10 MG/ML IJ SUSP
10.0000 mg | Freq: Once | INTRAMUSCULAR | Status: AC
  Administered 2021-07-14: 10 mg

## 2021-07-14 NOTE — Progress Notes (Signed)
Subjective:  ? ?Patient ID: Allen Rodgers, male   DOB: 76 y.o.   MRN: [id number]  ? ?HPI ?Patient presents stating still getting some pain in the big toe joint.  States it is improved but there continues to be an area on the inside that is quite bothersome and he feels like the gout is improving  ? ? ?ROS ? ? ?   ?Objective:  ?Physical Exam  ?Neurovascular status is intact with the patient's gout symptoms improved with redness diminished swelling diminished around the joint surface.  There continues to be pain on the inside of the joint and across the top  ?   ?Assessment:  ?May be some residual inflammatory process or still some gouty-like symptoms ? ?   ?Plan:  ?Reviewed condition and I went ahead today I did do sterile prep and injected the inside of the joint 3 mg Dexasone Kenalog 5 mg Xylocaine and took a periarticular across the top.  Hopefully this will knock out all remaining symptoms and if it does persist he will be seen back for me to reevaluate ?   ? ? ?

## 2021-08-17 DIAGNOSIS — H35372 Puckering of macula, left eye: Secondary | ICD-10-CM | POA: Diagnosis not present

## 2021-08-17 DIAGNOSIS — H2513 Age-related nuclear cataract, bilateral: Secondary | ICD-10-CM | POA: Diagnosis not present

## 2021-08-17 DIAGNOSIS — H25011 Cortical age-related cataract, right eye: Secondary | ICD-10-CM | POA: Diagnosis not present

## 2021-08-17 DIAGNOSIS — H524 Presbyopia: Secondary | ICD-10-CM | POA: Diagnosis not present

## 2021-11-06 DIAGNOSIS — R7301 Impaired fasting glucose: Secondary | ICD-10-CM | POA: Diagnosis not present

## 2021-11-06 DIAGNOSIS — Z1211 Encounter for screening for malignant neoplasm of colon: Secondary | ICD-10-CM | POA: Diagnosis not present

## 2021-11-06 DIAGNOSIS — Z125 Encounter for screening for malignant neoplasm of prostate: Secondary | ICD-10-CM | POA: Diagnosis not present

## 2021-11-06 DIAGNOSIS — J449 Chronic obstructive pulmonary disease, unspecified: Secondary | ICD-10-CM | POA: Diagnosis not present

## 2021-11-06 DIAGNOSIS — B009 Herpesviral infection, unspecified: Secondary | ICD-10-CM | POA: Diagnosis not present

## 2021-11-06 DIAGNOSIS — Z0000 Encounter for general adult medical examination without abnormal findings: Secondary | ICD-10-CM | POA: Diagnosis not present

## 2021-11-06 DIAGNOSIS — I10 Essential (primary) hypertension: Secondary | ICD-10-CM | POA: Diagnosis not present

## 2021-11-06 DIAGNOSIS — N529 Male erectile dysfunction, unspecified: Secondary | ICD-10-CM | POA: Diagnosis not present

## 2021-11-06 DIAGNOSIS — Z8679 Personal history of other diseases of the circulatory system: Secondary | ICD-10-CM | POA: Diagnosis not present

## 2021-11-06 DIAGNOSIS — E782 Mixed hyperlipidemia: Secondary | ICD-10-CM | POA: Diagnosis not present

## 2021-11-06 DIAGNOSIS — N400 Enlarged prostate without lower urinary tract symptoms: Secondary | ICD-10-CM | POA: Diagnosis not present

## 2021-11-07 DIAGNOSIS — Z1211 Encounter for screening for malignant neoplasm of colon: Secondary | ICD-10-CM | POA: Diagnosis not present

## 2021-12-13 DIAGNOSIS — Z23 Encounter for immunization: Secondary | ICD-10-CM | POA: Diagnosis not present

## 2022-01-08 DIAGNOSIS — Z23 Encounter for immunization: Secondary | ICD-10-CM | POA: Diagnosis not present

## 2022-08-17 DIAGNOSIS — H25013 Cortical age-related cataract, bilateral: Secondary | ICD-10-CM | POA: Diagnosis not present

## 2022-08-17 DIAGNOSIS — H2513 Age-related nuclear cataract, bilateral: Secondary | ICD-10-CM | POA: Diagnosis not present

## 2022-08-17 DIAGNOSIS — H52203 Unspecified astigmatism, bilateral: Secondary | ICD-10-CM | POA: Diagnosis not present

## 2022-08-17 DIAGNOSIS — H35373 Puckering of macula, bilateral: Secondary | ICD-10-CM | POA: Diagnosis not present

## 2022-11-15 DIAGNOSIS — R7303 Prediabetes: Secondary | ICD-10-CM | POA: Diagnosis not present

## 2022-11-15 DIAGNOSIS — J449 Chronic obstructive pulmonary disease, unspecified: Secondary | ICD-10-CM | POA: Diagnosis not present

## 2022-11-15 DIAGNOSIS — N400 Enlarged prostate without lower urinary tract symptoms: Secondary | ICD-10-CM | POA: Diagnosis not present

## 2022-11-15 DIAGNOSIS — Z0000 Encounter for general adult medical examination without abnormal findings: Secondary | ICD-10-CM | POA: Diagnosis not present

## 2022-11-15 DIAGNOSIS — I10 Essential (primary) hypertension: Secondary | ICD-10-CM | POA: Diagnosis not present

## 2022-11-15 DIAGNOSIS — N529 Male erectile dysfunction, unspecified: Secondary | ICD-10-CM | POA: Diagnosis not present

## 2022-11-15 DIAGNOSIS — Z125 Encounter for screening for malignant neoplasm of prostate: Secondary | ICD-10-CM | POA: Diagnosis not present

## 2022-11-15 DIAGNOSIS — Z23 Encounter for immunization: Secondary | ICD-10-CM | POA: Diagnosis not present

## 2022-11-15 DIAGNOSIS — Z1211 Encounter for screening for malignant neoplasm of colon: Secondary | ICD-10-CM | POA: Diagnosis not present

## 2022-11-24 DIAGNOSIS — Z1212 Encounter for screening for malignant neoplasm of rectum: Secondary | ICD-10-CM | POA: Diagnosis not present

## 2022-11-24 DIAGNOSIS — Z1211 Encounter for screening for malignant neoplasm of colon: Secondary | ICD-10-CM | POA: Diagnosis not present

## 2022-12-19 DIAGNOSIS — Z23 Encounter for immunization: Secondary | ICD-10-CM | POA: Diagnosis not present
